# Patient Record
Sex: Male | Born: 1985 | Race: Black or African American | Hispanic: No | Marital: Single | Smoking: Current every day smoker
Health system: Southern US, Community
[De-identification: ages and names within clinical notes are randomized; demographics above are authoritative.]

---

## 2017-11-25 ENCOUNTER — Encounter: Payer: Self-pay | Admitting: Emergency Medicine

## 2017-11-25 ENCOUNTER — Other Ambulatory Visit: Payer: Self-pay

## 2017-11-25 ENCOUNTER — Emergency Department
Admission: EM | Admit: 2017-11-25 | Discharge: 2017-11-25 | Disposition: A | Discharge status: Home / Self Care | Payer: Self-pay | Attending: Emergency Medicine | Admitting: Emergency Medicine

## 2017-11-25 ENCOUNTER — Emergency Department: Payer: Self-pay

## 2017-11-25 DIAGNOSIS — Y999 Unspecified external cause status: Secondary | ICD-10-CM | POA: Insufficient documentation

## 2017-11-25 DIAGNOSIS — Y939 Activity, unspecified: Secondary | ICD-10-CM | POA: Insufficient documentation

## 2017-11-25 DIAGNOSIS — M79641 Pain in right hand: Secondary | ICD-10-CM

## 2017-11-25 DIAGNOSIS — S60511A Abrasion of right hand, initial encounter: Secondary | ICD-10-CM | POA: Insufficient documentation

## 2017-11-25 DIAGNOSIS — Y929 Unspecified place or not applicable: Secondary | ICD-10-CM | POA: Insufficient documentation

## 2017-11-25 DIAGNOSIS — F17200 Nicotine dependence, unspecified, uncomplicated: Secondary | ICD-10-CM | POA: Insufficient documentation

## 2017-11-25 MED ORDER — AMOXICILLIN-POT CLAVULANATE 875-125 MG PO TABS: 1.0000 | ORAL_TABLET | Freq: Two times a day (BID) | ORAL | 0 refills | Status: DC

## 2017-11-25 MED ORDER — MELOXICAM 15 MG PO TABS: 15.0000 mg | ORAL_TABLET | Freq: Every day | ORAL | 0 refills | Status: DC

## 2017-11-25 MED ORDER — MELOXICAM 15 MG PO TABS: 15.0000 mg | ORAL_TABLET | Freq: Every day | ORAL | 0 refills | Status: AC

## 2017-11-25 MED ORDER — AMOXICILLIN-POT CLAVULANATE 875-125 MG PO TABS: 1.0000 | ORAL_TABLET | Freq: Two times a day (BID) | ORAL | 0 refills | Status: AC
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
## 2017-12-11 ENCOUNTER — Emergency Department: Payer: Self-pay

## 2017-12-11 ENCOUNTER — Other Ambulatory Visit: Payer: Self-pay

## 2017-12-11 ENCOUNTER — Emergency Department
Admission: EM | Admit: 2017-12-11 | Discharge: 2017-12-11 | Disposition: A | Discharge status: Home / Self Care | Payer: Self-pay | Attending: Emergency Medicine | Admitting: Emergency Medicine

## 2017-12-11 DIAGNOSIS — R112 Nausea with vomiting, unspecified: Secondary | ICD-10-CM

## 2017-12-11 DIAGNOSIS — K529 Noninfective gastroenteritis and colitis, unspecified: Secondary | ICD-10-CM | POA: Insufficient documentation

## 2017-12-11 DIAGNOSIS — R197 Diarrhea, unspecified: Secondary | ICD-10-CM

## 2017-12-11 DIAGNOSIS — F17200 Nicotine dependence, unspecified, uncomplicated: Secondary | ICD-10-CM | POA: Insufficient documentation

## 2017-12-11 LAB — COMPREHENSIVE METABOLIC PANEL
ALT: 15 U/L — ABNORMAL LOW (ref 17–63)
AST: 21 U/L (ref 15–41)
Albumin: 4 g/dL (ref 3.5–5.0)
Alkaline Phosphatase: 58 U/L (ref 38–126)
Anion gap: 8 (ref 5–15)
BUN: 14 mg/dL (ref 6–20)
CALCIUM: 8.8 mg/dL — AB (ref 8.9–10.3)
CHLORIDE: 107 mmol/L (ref 101–111)
CO2: 24 mmol/L (ref 22–32)
CREATININE: 1.04 mg/dL (ref 0.61–1.24)
Glucose, Bld: 100 mg/dL — ABNORMAL HIGH (ref 65–99)
Potassium: 4.3 mmol/L (ref 3.5–5.1)
Sodium: 139 mmol/L (ref 135–145)
TOTAL PROTEIN: 7.3 g/dL (ref 6.5–8.1)
Total Bilirubin: 0.7 mg/dL (ref 0.3–1.2)

## 2017-12-11 LAB — CBC
HCT: 42.7 % (ref 40.0–52.0)
Hemoglobin: 14.1 g/dL (ref 13.0–18.0)
MCH: 28 pg (ref 26.0–34.0)
MCHC: 32.9 g/dL (ref 32.0–36.0)
MCV: 85 fL (ref 80.0–100.0)
Platelets: 180 10*3/uL (ref 150–440)
RBC: 5.02 MIL/uL (ref 4.40–5.90)
RDW: 14.9 % — ABNORMAL HIGH (ref 11.5–14.5)
WBC: 5.3 10*3/uL (ref 3.8–10.6)

## 2017-12-11 LAB — LIPASE, BLOOD: Lipase: 57 U/L — ABNORMAL HIGH (ref 11–51)

## 2017-12-11 MED ORDER — ONDANSETRON HCL 4 MG/2ML IJ SOLN
4.0000 mg | Freq: Once | INTRAMUSCULAR | Status: AC | PRN
  Administered 2017-12-11: 4 mg via INTRAVENOUS

## 2017-12-11 MED ORDER — ONDANSETRON 4 MG PO TBDP: 4.0000 mg | ORAL_TABLET | Freq: Four times a day (QID) | ORAL | 0 refills | Status: AC | PRN

## 2017-12-11 MED ORDER — IOPAMIDOL (ISOVUE-300) INJECTION 61%
100.0000 mL | Freq: Once | INTRAVENOUS | Status: AC | PRN
  Administered 2017-12-11: 100 mL via INTRAVENOUS

## 2017-12-11 MED ORDER — SODIUM CHLORIDE 0.9 % IV BOLUS
1000.0000 mL | Freq: Once | INTRAVENOUS | Status: AC
  Administered 2017-12-11: 1000 mL via INTRAVENOUS

## 2017-12-11 MED ORDER — MORPHINE SULFATE (PF) 4 MG/ML IV SOLN
4.0000 mg | Freq: Once | INTRAVENOUS | Status: AC
  Administered 2017-12-11: 4 mg via INTRAVENOUS
  Filled 2017-12-11: qty 1

## 2017-12-11 MED ORDER — ONDANSETRON HCL 4 MG/2ML IJ SOLN: 4.0000 mg | Freq: Once | INTRAMUSCULAR | Status: DC

## 2017-12-11 MED ORDER — ONDANSETRON HCL 4 MG/2ML IJ SOLN
INTRAMUSCULAR | Status: AC
  Administered 2017-12-11: 4 mg via INTRAVENOUS
  Filled 2017-12-11: qty 2

## 2017-12-11 NOTE — ED Notes (Signed)
Patient transported to CT 

## 2017-12-11 NOTE — ED Notes (Signed)
Pt alert and oriented X4, active, cooperative, pt in NAD. RR even and unlabored, color WNL.  Pt informed to return if any life threatening symptoms occur.  Discharge and followup instructions reviewed. followup reviewed.    
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
## 2017-12-11 NOTE — Discharge Instructions (Addendum)

## 2017-12-11 NOTE — ED Notes (Signed)
Pt back from CT

## 2017-12-11 NOTE — ED Triage Notes (Signed)
C/O nausea, vomiting, diarrhea x 1 day.

## 2018-10-17 IMAGING — CT CT ABD-PELV W/ CM
2 of 4 series · 17 of 46 positions shown, 19 images · IV contrast (APPLIED)
Comparison: None.

CLINICAL DATA: Fever with nausea and vomiting as well as diarrhea
24 hours.

EXAM:
CT ABDOMEN AND PELVIS WITH CONTRAST
TECHNIQUE: Multidetector CT imaging of the abdomen and pelvis was performed
using the standard protocol following bolus administration of
intravenous contrast.
CONTRAST:  100mL PK7TC1-DLL IOPAMIDOL ([id number]-DLL) INJECTION 61%

[Series 2: routine abd/pel with · axial · 0.73mm/px · z∈[-1058,-678]mm · 14 of 84 slices shown, 16 images]
[im 4/84  soft-tissue]
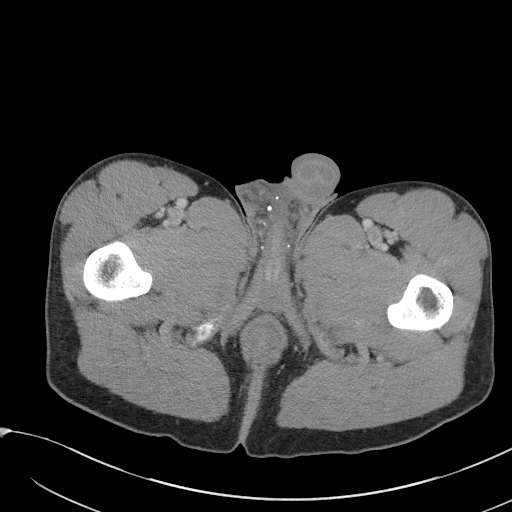
[im 4/84  bone]
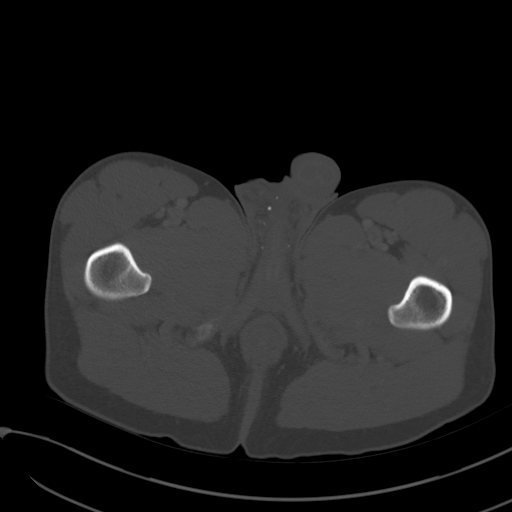
[im 10/84  soft-tissue]
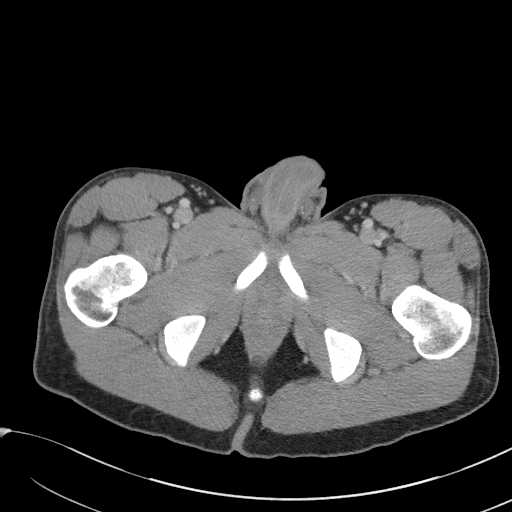
[im 16/84  soft-tissue]
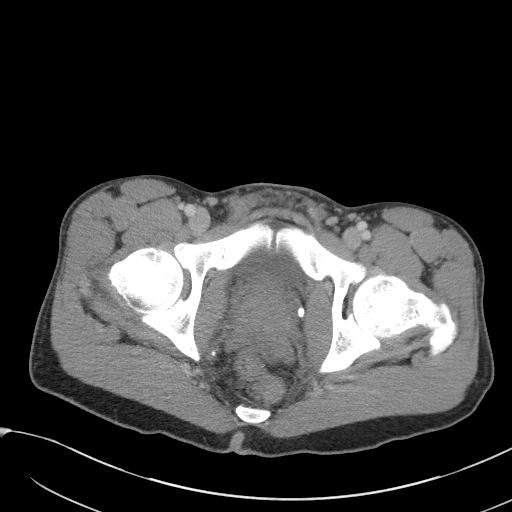
[im 23/84  soft-tissue]
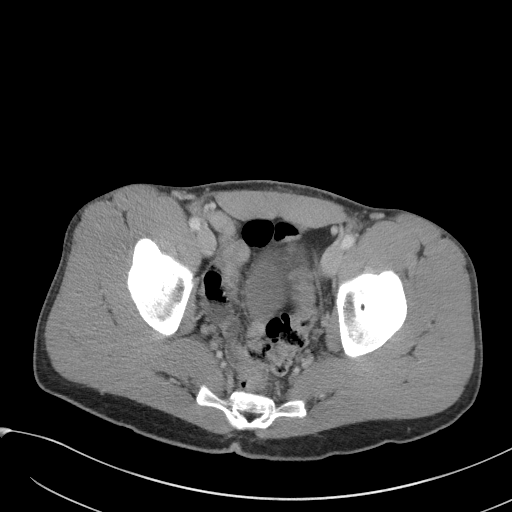
[im 29/84  soft-tissue]
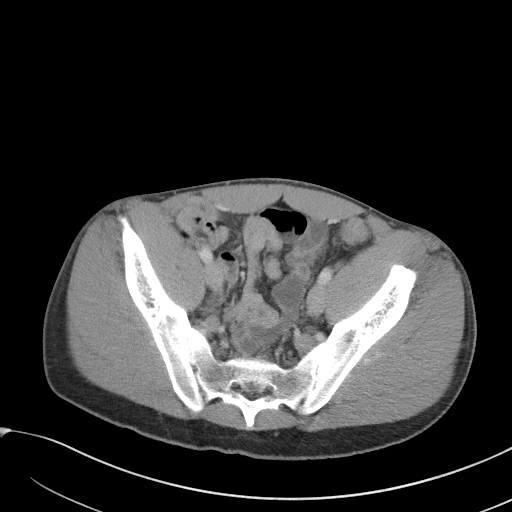
[im 32/84  soft-tissue]
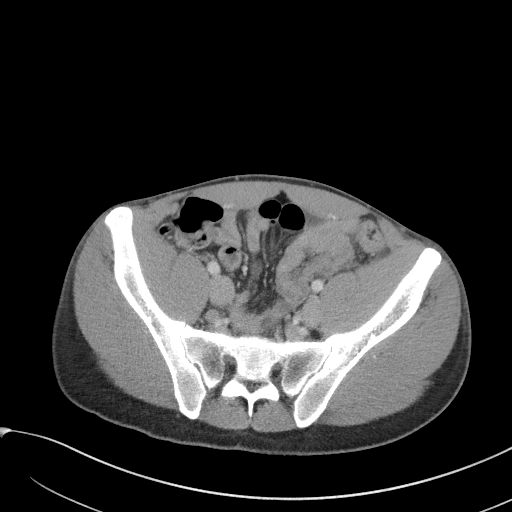
[im 39/84  soft-tissue]
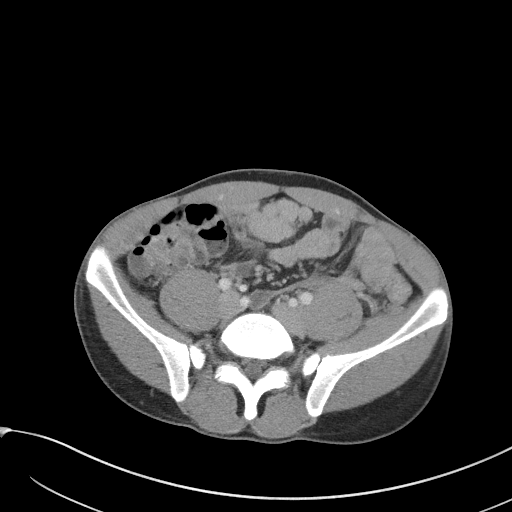
[im 45/84  soft-tissue]
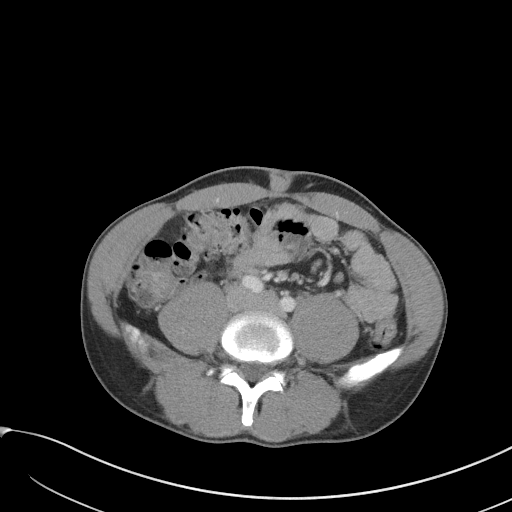
[im 52/84  soft-tissue]
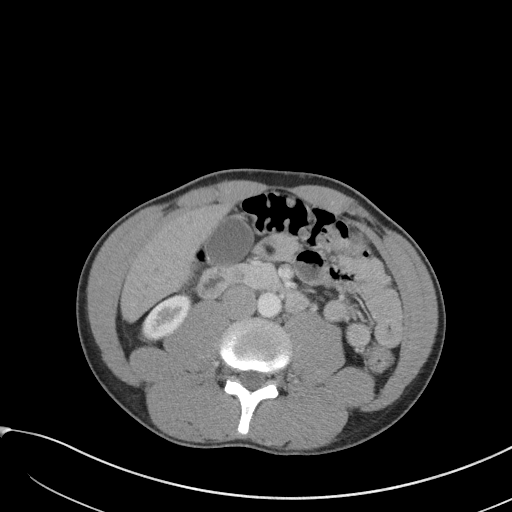
[im 52/84  bone]
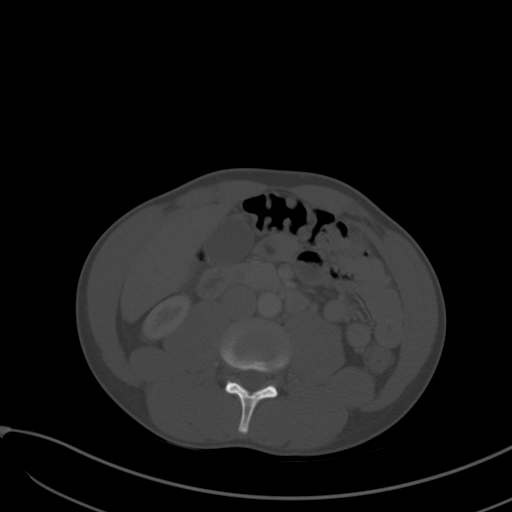
[im 55/84  soft-tissue]
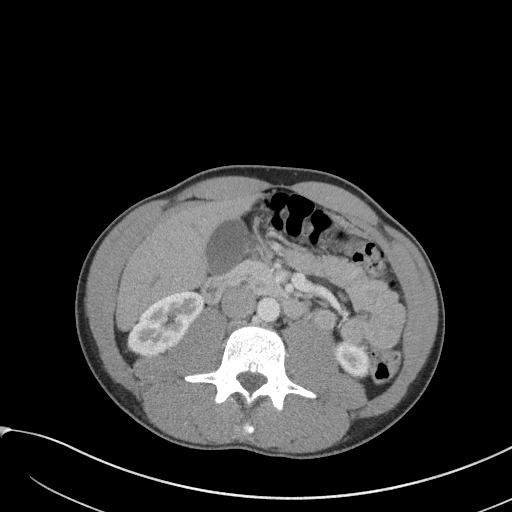
[im 61/84  soft-tissue]
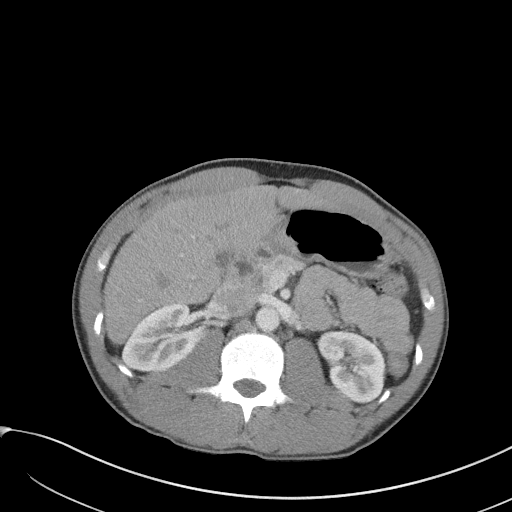
[im 68/84  soft-tissue]
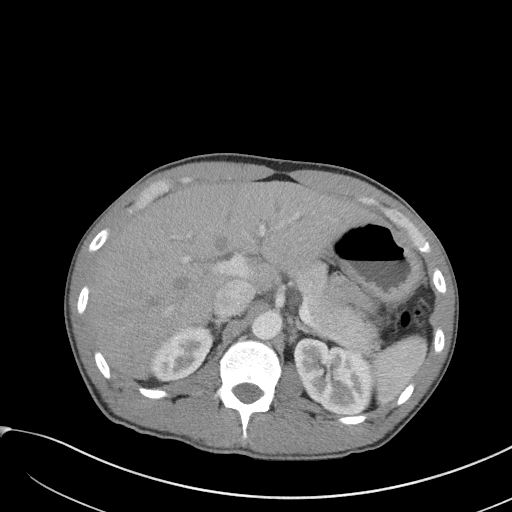
[im 74/84  soft-tissue]
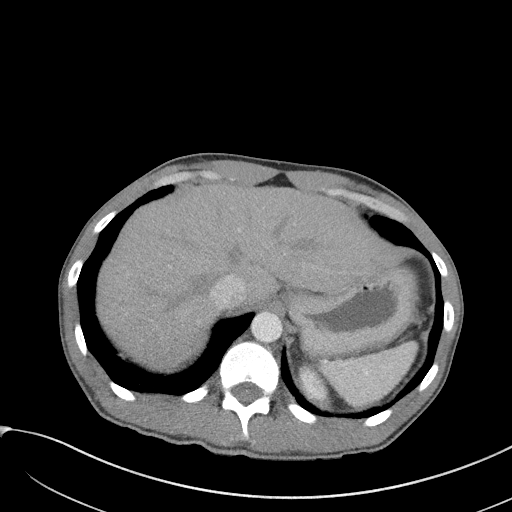
[im 80/84  soft-tissue]
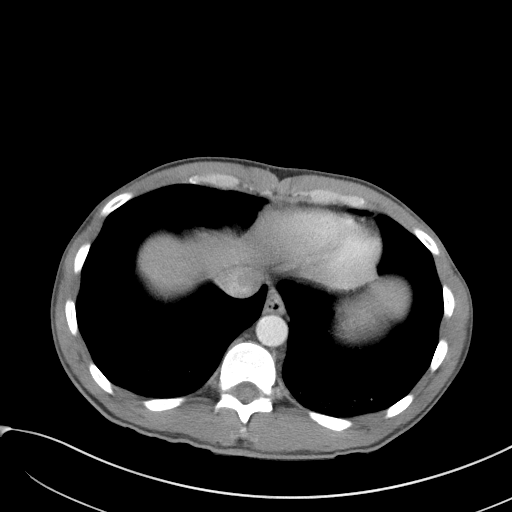

[Series 5: coronal st · coronal · 0.65mm/px · 3 of 74 slices shown]
[im 25/74  soft-tissue]
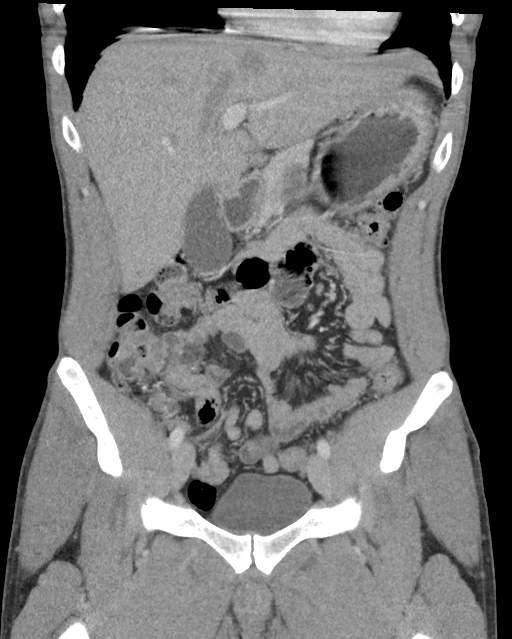
[im 33/74  soft-tissue]
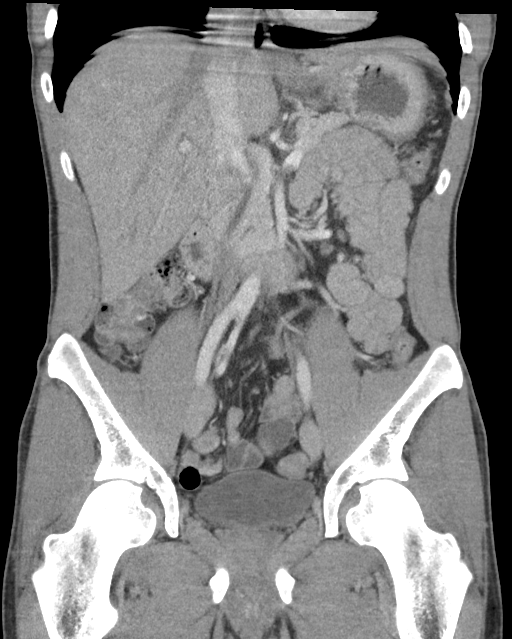
[im 41/74  soft-tissue]
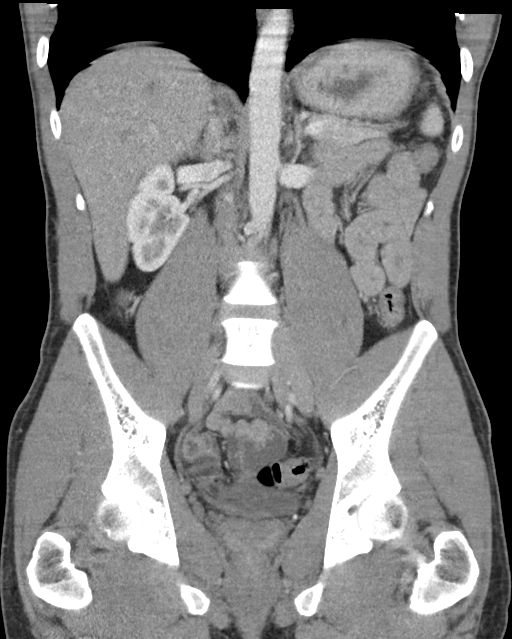

[17 of 46 positions shown; findings below may reference images not displayed]

FINDINGS: Lower chest: No acute abnormality.

Hepatobiliary: Normal.

Pancreas: Normal.

Spleen: Normal.

Adrenals/Urinary Tract: Normal.

Stomach/Bowel: Stomach and small bowel are normal. Appendix is
normal. Colon is normal.

Vascular/Lymphatic: Normal.

Reproductive: Normal.

Other: No free fluid or focal inflammatory change.

Musculoskeletal: Normal.
IMPRESSION: No acute findings in the abdomen/pelvis.
